# Patient Record
Sex: Female | Born: 1955 | Race: Black or African American | Hispanic: No | Marital: Married | Smoking: Never smoker
Health system: Southern US, Community
[De-identification: ages and names within clinical notes are randomized; demographics above are authoritative.]

## PROBLEM LIST (undated history)

## (undated) HISTORY — PX: ABDOMINAL HYSTERECTOMY: SHX81

---

## 2000-06-20 ENCOUNTER — Emergency Department (HOSPITAL_COMMUNITY): Admission: EM | Admit: 2000-06-20 | Discharge: 2000-06-20 | Payer: Self-pay | Admitting: Emergency Medicine

## 2000-06-20 ENCOUNTER — Encounter: Payer: Self-pay | Admitting: Emergency Medicine

## 2012-01-04 ENCOUNTER — Other Ambulatory Visit: Payer: Self-pay | Admitting: Obstetrics and Gynecology

## 2012-01-04 DIAGNOSIS — Z1231 Encounter for screening mammogram for malignant neoplasm of breast: Secondary | ICD-10-CM

## 2012-01-11 ENCOUNTER — Ambulatory Visit (HOSPITAL_BASED_OUTPATIENT_CLINIC_OR_DEPARTMENT_OTHER)
Admission: RE | Admit: 2012-01-11 | Discharge: 2012-01-11 | Disposition: A | Discharge status: Home / Self Care | Payer: BC Managed Care – PPO | Source: Ambulatory Visit | Attending: Obstetrics and Gynecology | Admitting: Obstetrics and Gynecology

## 2012-01-11 DIAGNOSIS — Z1231 Encounter for screening mammogram for malignant neoplasm of breast: Secondary | ICD-10-CM

## 2014-02-19 ENCOUNTER — Encounter: Payer: Self-pay | Admitting: Podiatry

## 2014-02-19 ENCOUNTER — Ambulatory Visit (INDEPENDENT_AMBULATORY_CARE_PROVIDER_SITE_OTHER): Payer: BC Managed Care – PPO | Admitting: Podiatry

## 2014-02-19 VITALS — BP 130/82 | HR 72 | Ht 66.5 in | Wt 197.0 lb

## 2014-02-19 DIAGNOSIS — M67471 Ganglion, right ankle and foot: Secondary | ICD-10-CM

## 2014-02-19 DIAGNOSIS — M6740 Ganglion, unspecified site: Secondary | ICD-10-CM

## 2014-02-19 DIAGNOSIS — M79671 Pain in right foot: Secondary | ICD-10-CM

## 2014-02-19 DIAGNOSIS — M79673 Pain in unspecified foot: Secondary | ICD-10-CM | POA: Insufficient documentation

## 2014-02-19 DIAGNOSIS — M79609 Pain in unspecified limb: Secondary | ICD-10-CM

## 2014-02-19 NOTE — Progress Notes (Signed)
Subjective: 58 year old female presents complaining of a cystic mass over 2nd digit right. Had it for about 4 months and is getting painful. Patient recalls an incident a heavy object fallen to her foot about 4 months ago.  Objective: Neurovascular status are within normal. Cystic mass raised over distal dorsal end just proximal to nail base 2nd digit right, about 0.8cm in diameter.  No erythema or edema noted.  Assessment: Ganglionic cyst over DIPJ of 2nd digit right foot, 0.8cm in diameter.  Plan: Reviewed findings and available options. Cyst lanced and drained.  Betadine dressing with compression bandage applied. Home care instruction dispensed.

## 2014-02-19 NOTE — Patient Instructions (Signed)
Seen for a cyst on 2nd toe right foot. Noted gel like clear substance drained like from a ganglionic cyst.  Maintain compression bandage for a month. Return as needed.

## 2014-02-22 ENCOUNTER — Encounter: Payer: Self-pay | Admitting: Podiatry

## 2016-07-08 ENCOUNTER — Emergency Department (HOSPITAL_BASED_OUTPATIENT_CLINIC_OR_DEPARTMENT_OTHER)
Admission: EM | Admit: 2016-07-08 | Discharge: 2016-07-08 | Disposition: A | Discharge status: Home / Self Care | Payer: BC Managed Care – PPO | Attending: Emergency Medicine | Admitting: Emergency Medicine

## 2016-07-08 ENCOUNTER — Encounter (HOSPITAL_BASED_OUTPATIENT_CLINIC_OR_DEPARTMENT_OTHER): Payer: Self-pay | Admitting: *Deleted

## 2016-07-08 ENCOUNTER — Emergency Department (HOSPITAL_BASED_OUTPATIENT_CLINIC_OR_DEPARTMENT_OTHER): Payer: BC Managed Care – PPO

## 2016-07-08 DIAGNOSIS — R0602 Shortness of breath: Secondary | ICD-10-CM | POA: Insufficient documentation

## 2016-07-08 DIAGNOSIS — R0600 Dyspnea, unspecified: Secondary | ICD-10-CM

## 2016-07-08 LAB — CBC WITH DIFFERENTIAL/PLATELET
BASOS ABS: 0 10*3/uL (ref 0.0–0.1)
BASOS PCT: 1 %
Eosinophils Absolute: 0 10*3/uL (ref 0.0–0.7)
Eosinophils Relative: 1 %
HCT: 38 % (ref 36.0–46.0)
HEMOGLOBIN: 13.8 g/dL (ref 12.0–15.0)
LYMPHS PCT: 40 %
Lymphs Abs: 1.3 10*3/uL (ref 0.7–4.0)
MCH: 27.4 pg (ref 26.0–34.0)
MCHC: 36.3 g/dL — ABNORMAL HIGH (ref 30.0–36.0)
MCV: 75.5 fL — AB (ref 78.0–100.0)
Monocytes Absolute: 0.2 10*3/uL (ref 0.1–1.0)
Monocytes Relative: 5 %
NEUTROS PCT: 53 %
Neutro Abs: 1.8 10*3/uL (ref 1.7–7.7)
Platelets: 193 10*3/uL (ref 150–400)
RBC: 5.03 MIL/uL (ref 3.87–5.11)
RDW: 13.2 % (ref 11.5–15.5)
WBC: 3.3 10*3/uL — AB (ref 4.0–10.5)

## 2016-07-08 LAB — BASIC METABOLIC PANEL
ANION GAP: 6 (ref 5–15)
BUN: 14 mg/dL (ref 6–20)
CO2: 27 mmol/L (ref 22–32)
Calcium: 9.2 mg/dL (ref 8.9–10.3)
Chloride: 105 mmol/L (ref 101–111)
Creatinine, Ser: 0.81 mg/dL (ref 0.44–1.00)
GLUCOSE: 101 mg/dL — AB (ref 65–99)
POTASSIUM: 3.7 mmol/L (ref 3.5–5.1)
SODIUM: 138 mmol/L (ref 135–145)

## 2016-07-08 LAB — TROPONIN I: Troponin I: <0.03 ng/mL (ref <0.03)

## 2016-07-08 NOTE — ED Triage Notes (Signed)
Pt c/o SOb and increased BP x 2 hrs

## 2016-07-08 NOTE — ED Notes (Signed)
Pt transported to XR at this time.

## 2016-07-08 NOTE — ED Notes (Signed)
Pt on cardiac monitor and automatic VS 

## 2016-07-08 NOTE — ED Provider Notes (Signed)
[facility]   CSN: [id number] Arrival date & time: 07/08/16  1027     History   Chief Complaint Chief Complaint  Patient presents with  . Shortness of Breath    HPI Shaddai Shapley is a 61 y.o. female.  The history is provided by the patient. No language interpreter was used.  Shortness of Breath     Wandalene Abrams is a 62 y.o. female who presents to the Emergency Department complaining of sob.  Over the last 2-3 days she's had increased fatigue with mild runny nose and scratchy throat. Today when she was making breakfast she developed shortness of breath and a gripping sensation in her right arm. Symptoms lasted about 2 hours. Her blood pressure at that time was elevated in the 130s to 140s systolic and her heart rate was in the 80s. Her blood pressure normally runs 109 systolic. No chest pain fever, coughing, abdominal pain, leg swelling or pain. She has no medical history and takes no medications. No history of blood clots. No family history of early coronary artery disease.  History reviewed. No pertinent past medical history.  Patient Active Problem List   Diagnosis Date Noted  . Ganglion cyst of right foot 02/19/2014  . Pain, foot 02/19/2014    Past Surgical History:  Procedure Laterality Date  . ABDOMINAL HYSTERECTOMY      OB History    No data available       Home Medications    Prior to Admission medications   Medication Sig Start Date End Date Taking? Authorizing Provider  Fish Oil-Cholecalciferol (FISH OIL + D3 PO) Take by mouth daily.    Historical Provider, MD  Multiple Vitamin (MULTIVITAMIN) tablet Take 1 tablet by mouth 2 (two) times daily.    Historical Provider, MD  Probiotic Product (PRO-BIOTIC BLEND) CAPS Take by mouth daily.    Historical Provider, MD    Family History History reviewed. No pertinent family history.  Social History Social History  Substance Use Topics  . Smoking status: Never Smoker  . Smokeless  tobacco: Never Used  . Alcohol use No     Allergies   Penicillins   Review of Systems Review of Systems  Respiratory: Positive for shortness of breath.   All other systems reviewed and are negative.    Physical Exam Updated Vital Signs BP 95/66   Pulse 75   Temp 98.8 F (37.1 C) (Oral)   Resp 12   Ht 5\' 4"  (1.626 m)   Wt 180 lb (81.6 kg)   SpO2 100%   BMI 30.90 kg/m   Physical Exam  Constitutional: She is oriented to person, place, and time. She appears well-developed and well-nourished.  HENT:  Head: Normocephalic and atraumatic.  Cardiovascular: Normal rate and regular rhythm.   No murmur heard. Pulmonary/Chest: Effort normal and breath sounds normal. No respiratory distress.  Abdominal: Soft. There is no tenderness. There is no rebound and no guarding.  Musculoskeletal: She exhibits no edema or tenderness.  Neurological: She is alert and oriented to person, place, and time.  Skin: Skin is warm and dry.  Psychiatric: She has a normal mood and affect. Her behavior is normal.  Nursing note and vitals reviewed.    ED Treatments / Results  Labs (all labs ordered are listed, but only abnormal results are displayed) Labs Reviewed  BASIC METABOLIC PANEL - Abnormal; Notable for the following:       Result Value   Glucose, Bld 101 (*)  All other components within normal limits  CBC WITH DIFFERENTIAL/PLATELET - Abnormal; Notable for the following:    WBC 3.3 (*)    MCV 75.5 (*)    MCHC 36.3 (*)    All other components within normal limits  TROPONIN I    EKG  EKG Interpretation  Date/Time:  Sunday July 08 2016 10:54:30 EST Ventricular Rate:  78 PR Interval:    QRS Duration: 78 QT Interval:  361 QTC Calculation: 412 R Axis:   38 Text Interpretation:  Sinus rhythm Biatrial enlargement Anteroseptal infarct, age indeterminate Baseline wander in lead(s) V5 Confirmed by Lincoln Brighamees, Liz [phone]([id number]) on 07/08/2016 11:08:01 AM Also confirmed by Lincoln Brighamees, Liz [phone]([id number]), editor  [place]  CCT, BEVERLY ([id number])  on 07/08/2016 12:16:22 PM       Radiology Dg Chest 2 View  Result Date: 07/08/2016 CLINICAL DATA:  Acute onset shortness of breath and dizziness today. Hypertension. EXAM: CHEST  2 VIEW COMPARISON:  None. FINDINGS: The heart size and mediastinal contours are within normal limits. Both lungs are clear. The visualized skeletal structures are unremarkable. IMPRESSION: No active cardiopulmonary disease. Electronically Signed   By: Myles RosenthalJohn  Stahl M.D.   On: 07/08/2016 11:39    Procedures Procedures (including critical care time)  Medications Ordered in ED Medications - No data to display   Initial Impression / Assessment and Plan / ED Course  I have reviewed the triage vital signs and the nursing notes.  Pertinent labs & imaging results that were available during my care of the patient were reviewed by me and considered in my medical decision making (see chart for details).     Patient here following an episode of shortness of breath and right arm discomfort. She is asymptomatic in the emergency department. She is low risk for cardiac event and presentation is not consistent with ACS, PE, CHF, pneumonia. Discussed with patient outpatient follow up and return precautions.  Final Clinical Impressions(s) / ED Diagnoses   Final diagnoses:  Dyspnea, unspecified type    New Prescriptions Discharge Medication List as of 07/08/2016 12:32 PM       Tilden FossaElizabeth Zacary Bauer, MD 07/09/16 1119

## 2017-12-07 IMAGING — CR DG CHEST 2V
2 series · 2 of 2 positions shown · non-contrast
Comparison: None.

CLINICAL DATA: Acute onset shortness of breath and dizziness today.
Hypertension.

EXAM:
CHEST  2 VIEW

[w chest pa]
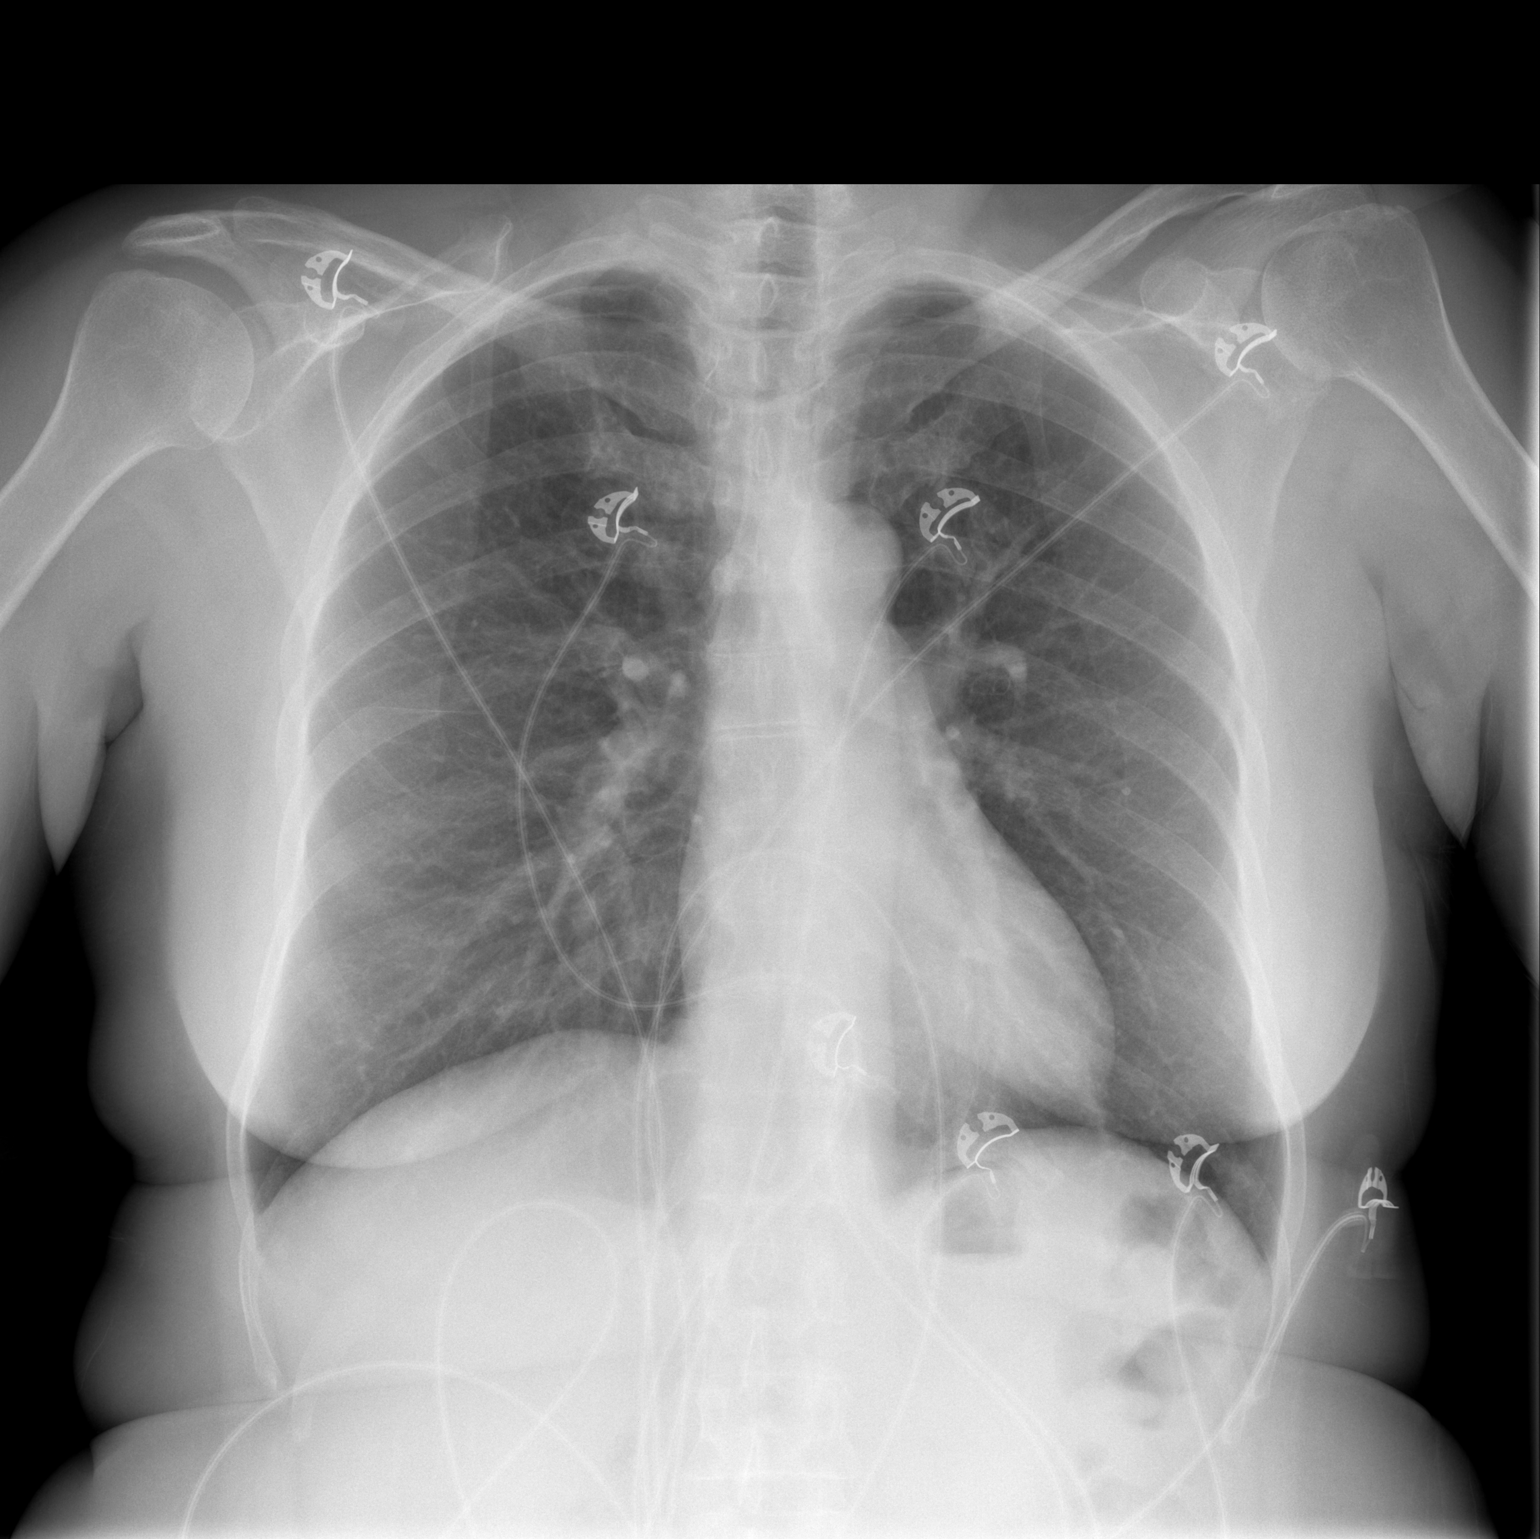

[w chest lat]
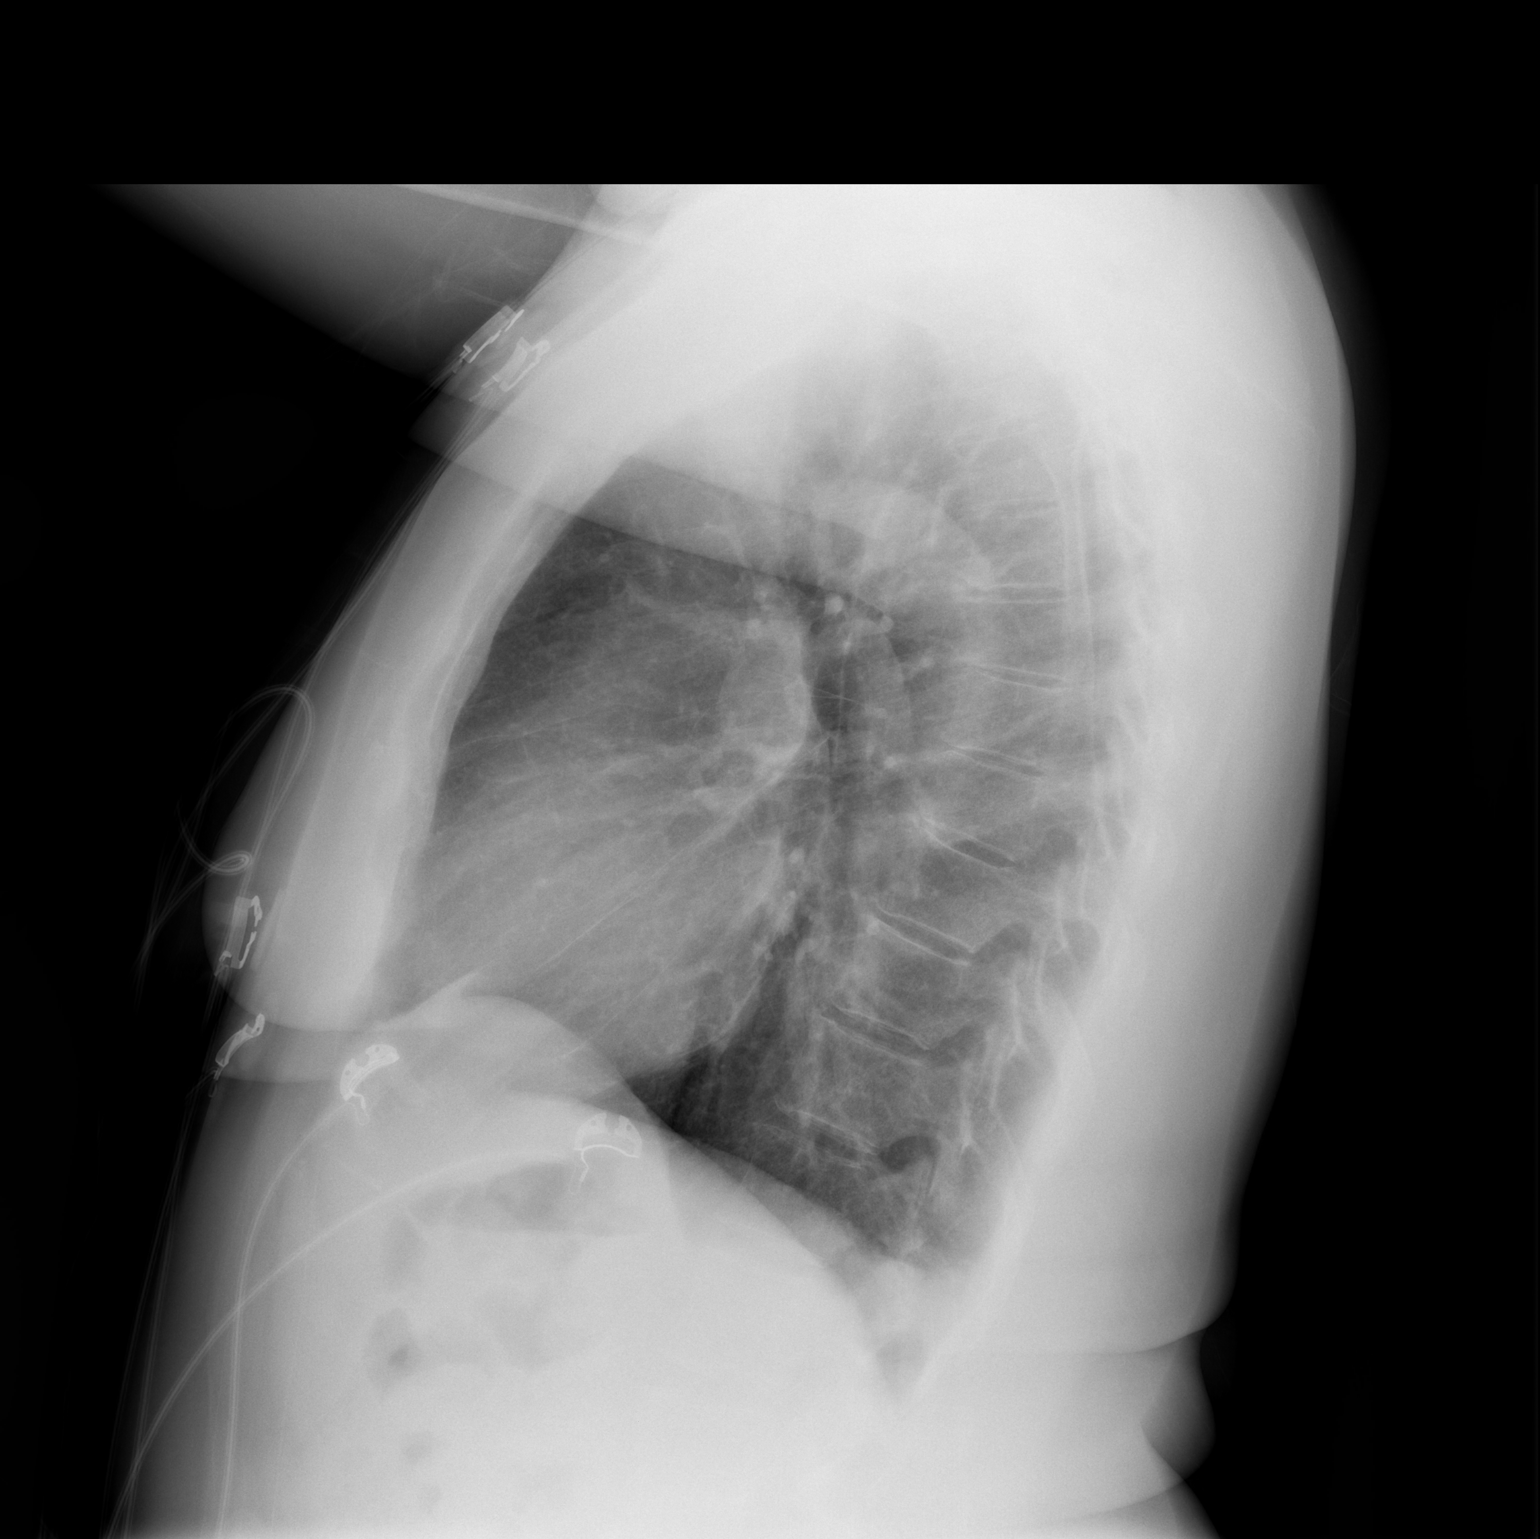

[2 of 2 positions shown; findings below may reference images not displayed]

FINDINGS: The heart size and mediastinal contours are within normal limits.
Both lungs are clear. The visualized skeletal structures are
unremarkable.
IMPRESSION: No active cardiopulmonary disease.

## 2020-06-28 ENCOUNTER — Emergency Department (HOSPITAL_BASED_OUTPATIENT_CLINIC_OR_DEPARTMENT_OTHER)
Admission: EM | Admit: 2020-06-28 | Discharge: 2020-06-28 | Disposition: A | Discharge status: Home / Self Care | Payer: BC Managed Care – PPO | Attending: Emergency Medicine | Admitting: Emergency Medicine

## 2020-06-28 ENCOUNTER — Encounter (HOSPITAL_BASED_OUTPATIENT_CLINIC_OR_DEPARTMENT_OTHER): Payer: Self-pay

## 2020-06-28 ENCOUNTER — Other Ambulatory Visit: Payer: Self-pay

## 2020-06-28 ENCOUNTER — Emergency Department (HOSPITAL_BASED_OUTPATIENT_CLINIC_OR_DEPARTMENT_OTHER): Payer: BC Managed Care – PPO

## 2020-06-28 DIAGNOSIS — R911 Solitary pulmonary nodule: Secondary | ICD-10-CM | POA: Insufficient documentation

## 2020-06-28 DIAGNOSIS — R42 Dizziness and giddiness: Secondary | ICD-10-CM | POA: Diagnosis not present

## 2020-06-28 DIAGNOSIS — Z20822 Contact with and (suspected) exposure to covid-19: Secondary | ICD-10-CM | POA: Diagnosis not present

## 2020-06-28 DIAGNOSIS — R0602 Shortness of breath: Secondary | ICD-10-CM | POA: Diagnosis not present

## 2020-06-28 DIAGNOSIS — R509 Fever, unspecified: Secondary | ICD-10-CM | POA: Diagnosis present

## 2020-06-28 DIAGNOSIS — J181 Lobar pneumonia, unspecified organism: Secondary | ICD-10-CM | POA: Diagnosis not present

## 2020-06-28 DIAGNOSIS — R519 Headache, unspecified: Secondary | ICD-10-CM | POA: Diagnosis not present

## 2020-06-28 DIAGNOSIS — J189 Pneumonia, unspecified organism: Secondary | ICD-10-CM

## 2020-06-28 LAB — COMPREHENSIVE METABOLIC PANEL
ALT: 37 U/L (ref 0–44)
AST: 37 U/L (ref 15–41)
Albumin: 3.7 g/dL (ref 3.5–5.0)
Alkaline Phosphatase: 66 U/L (ref 38–126)
Anion gap: 11 (ref 5–15)
BUN: 7 mg/dL — ABNORMAL LOW (ref 8–23)
CO2: 22 mmol/L (ref 22–32)
Calcium: 8.6 mg/dL — ABNORMAL LOW (ref 8.9–10.3)
Chloride: 101 mmol/L (ref 98–111)
Creatinine, Ser: 0.85 mg/dL (ref 0.44–1.00)
GFR, Estimated: >60 mL/min (ref >60)
Glucose, Bld: 114 mg/dL — ABNORMAL HIGH (ref 70–99)
Potassium: 3.7 mmol/L (ref 3.5–5.1)
Sodium: 134 mmol/L — ABNORMAL LOW (ref 135–145)
Total Bilirubin: 0.8 mg/dL (ref 0.3–1.2)
Total Protein: 7.1 g/dL (ref 6.5–8.1)

## 2020-06-28 LAB — CBC WITH DIFFERENTIAL/PLATELET
Abs Immature Granulocytes: 0.03 10*3/uL (ref 0.00–0.07)
Basophils Absolute: 0 10*3/uL (ref 0.0–0.1)
Basophils Relative: 0 %
Eosinophils Absolute: 0 10*3/uL (ref 0.0–0.5)
Eosinophils Relative: 0 %
HCT: 38.9 % (ref 36.0–46.0)
Hemoglobin: 13.9 g/dL (ref 12.0–15.0)
Immature Granulocytes: 0 %
Lymphocytes Relative: 10 %
Lymphs Abs: 1 10*3/uL (ref 0.7–4.0)
MCH: 27.5 pg (ref 26.0–34.0)
MCHC: 35.7 g/dL (ref 30.0–36.0)
MCV: 77 fL — ABNORMAL LOW (ref 80.0–100.0)
Monocytes Absolute: 0.6 10*3/uL (ref 0.1–1.0)
Monocytes Relative: 6 %
Neutro Abs: 8.3 10*3/uL — ABNORMAL HIGH (ref 1.7–7.7)
Neutrophils Relative %: 84 %
Platelets: 197 10*3/uL (ref 150–400)
RBC: 5.05 MIL/uL (ref 3.87–5.11)
RDW: 13.2 % (ref 11.5–15.5)
WBC: 9.9 10*3/uL (ref 4.0–10.5)
nRBC: 0 % (ref 0.0–0.2)

## 2020-06-28 LAB — RESP PANEL BY RT-PCR (FLU A&B, COVID) ARPGX2
Influenza A by PCR: NEGATIVE
Influenza B by PCR: NEGATIVE
SARS Coronavirus 2 by RT PCR: NEGATIVE

## 2020-06-28 LAB — FIBRINOGEN: Fibrinogen: 770 mg/dL — ABNORMAL HIGH (ref 210–475)

## 2020-06-28 LAB — URINALYSIS, ROUTINE W REFLEX MICROSCOPIC
Bilirubin Urine: NEGATIVE
Glucose, UA: NEGATIVE mg/dL
Ketones, ur: 40 mg/dL — AB
Nitrite: NEGATIVE
Protein, ur: NEGATIVE mg/dL
Specific Gravity, Urine: 1.01 (ref 1.005–1.030)
pH: 6 (ref 5.0–8.0)

## 2020-06-28 LAB — C-REACTIVE PROTEIN: CRP: 10 mg/dL — ABNORMAL HIGH (ref <1.0)

## 2020-06-28 LAB — FERRITIN: Ferritin: 159 ng/mL (ref 11–307)

## 2020-06-28 LAB — LACTIC ACID, PLASMA: Lactic Acid, Venous: 0.9 mmol/L (ref 0.5–1.9)

## 2020-06-28 LAB — URINALYSIS, MICROSCOPIC (REFLEX)

## 2020-06-28 LAB — LACTATE DEHYDROGENASE: LDH: 233 U/L — ABNORMAL HIGH (ref 98–192)

## 2020-06-28 LAB — PROCALCITONIN: Procalcitonin: 0.25 ng/mL

## 2020-06-28 LAB — D-DIMER, QUANTITATIVE: D-Dimer, Quant: 2.84 ug/mL-FEU — ABNORMAL HIGH (ref 0.00–0.50)

## 2020-06-28 LAB — TRIGLYCERIDES: Triglycerides: 51 mg/dL (ref <150)

## 2020-06-28 MED ORDER — ACETAMINOPHEN 325 MG PO TABS
650.0000 mg | ORAL_TABLET | Freq: Once | ORAL | Status: AC
  Administered 2020-06-28: 650 mg via ORAL

## 2020-06-28 MED ORDER — ACETAMINOPHEN 325 MG PO TABS
ORAL_TABLET | ORAL | Status: AC
  Filled 2020-06-28: qty 2

## 2020-06-28 MED ORDER — DOXYCYCLINE HYCLATE 100 MG PO CAPS: 100.0000 mg | ORAL_CAPSULE | Freq: Two times a day (BID) | ORAL | 0 refills | Status: AC

## 2020-06-28 MED ORDER — SODIUM CHLORIDE 0.9 % IV SOLN
500.0000 mg | Freq: Once | INTRAVENOUS | Status: AC
  Administered 2020-06-28: 500 mg via INTRAVENOUS
  Filled 2020-06-28: qty 500

## 2020-06-28 MED ORDER — IOHEXOL 350 MG/ML SOLN
100.0000 mL | Freq: Once | INTRAVENOUS | Status: AC | PRN
  Administered 2020-06-28: 75 mL via INTRAVENOUS

## 2020-06-28 MED ORDER — SODIUM CHLORIDE 0.9 % IV SOLN
1.0000 g | Freq: Once | INTRAVENOUS | Status: AC
  Administered 2020-06-28: 1 g via INTRAVENOUS
  Filled 2020-06-28: qty 10

## 2020-06-28 NOTE — ED Triage Notes (Signed)
Fever, cough, sob, headache since Saturday.  Negative home covid test yesterday.

## 2020-06-28 NOTE — ED Provider Notes (Signed)
[facility] EMERGENCY DEPARTMENT Provider Note  CSN: [id number] Arrival date & time: 06/28/20 0755    History Chief Complaint  Patient presents with  . Fever    HPI  Maureen Green is a 65 y.o. female with no significant PMH reports 2 days of fever, cough, headaches, and dizziness. She has not had any sick contacts. She took a home Covid test yesterday which was neg. She has been vaccinated, but not boosted. She reports some SOB, especially with walking around.    History reviewed. No pertinent past medical history.  Past Surgical History:  Procedure Laterality Date  . ABDOMINAL HYSTERECTOMY      History reviewed. No pertinent family history.  Social History   Tobacco Use  . Smoking status: Never Smoker  . Smokeless tobacco: Never Used  Substance Use Topics  . Alcohol use: No  . Drug use: No     Home Medications Prior to Admission medications   Medication Sig Start Date End Date Taking? Authorizing Provider  doxycycline (VIBRAMYCIN) 100 MG capsule Take 1 capsule (100 mg total) by mouth 2 (two) times daily. 06/28/20  Yes Pollyann Savoy, MD  Fish Oil-Cholecalciferol (FISH OIL + D3 PO) Take by mouth daily.    [provider]  Multiple Vitamin (MULTIVITAMIN) tablet Take 1 tablet by mouth 2 (two) times daily.    [provider]  Probiotic Product (PRO-BIOTIC BLEND) CAPS Take by mouth daily.    [provider]     Allergies    Penicillins   Review of Systems   Review of Systems A comprehensive review of systems was completed and negative except as noted in HPI.    Physical Exam BP 132/82 (BP Location: Right Arm)   Pulse 82   Temp 98.6 F (37 C) (Oral)   Resp 20   Ht 5\' 4"  (1.626 m)   Wt 81.6 kg   SpO2 95%   BMI 30.90 kg/m   Physical Exam Vitals and nursing note reviewed.  Constitutional:      Appearance: Normal appearance.  HENT:     Head: Normocephalic and atraumatic.     Nose: Nose normal.      Mouth/Throat:     Mouth: Mucous membranes are moist.  Eyes:     Extraocular Movements: Extraocular movements intact.     Conjunctiva/sclera: Conjunctivae normal.  Cardiovascular:     Rate and Rhythm: Normal rate.  Pulmonary:     Effort: Pulmonary effort is normal.     Breath sounds: Normal breath sounds.  Abdominal:     General: Abdomen is flat.     Palpations: Abdomen is soft.     Tenderness: There is no abdominal tenderness.  Musculoskeletal:        General: No swelling. Normal range of motion.     Cervical back: Neck supple.  Skin:    General: Skin is warm and dry.  Neurological:     General: No focal deficit present.     Mental Status: She is alert.  Psychiatric:        Mood and Affect: Mood normal.      ED Results / Procedures / Treatments   Labs (all labs ordered are listed, but only abnormal results are displayed) Labs Reviewed  CBC WITH DIFFERENTIAL/PLATELET - Abnormal; Notable for the following components:      Result Value   MCV 77.0 (*)    Neutro Abs 8.3 (*)    All other components within normal limits  COMPREHENSIVE  METABOLIC PANEL - Abnormal; Notable for the following components:   Sodium 134 (*)    Glucose, Bld 114 (*)    BUN 7 (*)    Calcium 8.6 (*)    All other components within normal limits  D-DIMER, QUANTITATIVE (NOT AT [facility]) - Abnormal; Notable for the following components:   D-Dimer, Quant 2.84 (*)    All other components within normal limits  LACTATE DEHYDROGENASE - Abnormal; Notable for the following components:   LDH 233 (*)    All other components within normal limits  FIBRINOGEN - Abnormal; Notable for the following components:   Fibrinogen 770 (*)    All other components within normal limits  C-REACTIVE PROTEIN - Abnormal; Notable for the following components:   CRP 10.0 (*)    All other components within normal limits  URINALYSIS, ROUTINE W REFLEX MICROSCOPIC - Abnormal; Notable for the following components:   Hgb urine dipstick  SMALL (*)    Ketones, ur 40 (*)    Leukocytes,Ua MODERATE (*)    All other components within normal limits  URINALYSIS, MICROSCOPIC (REFLEX) - Abnormal; Notable for the following components:   Bacteria, UA FEW (*)    All other components within normal limits  RESP PANEL BY RT-PCR (FLU A&B, COVID) ARPGX2  CULTURE, BLOOD (ROUTINE X 2)  CULTURE, BLOOD (ROUTINE X 2)  LACTIC ACID, PLASMA  PROCALCITONIN  FERRITIN  TRIGLYCERIDES    EKG EKG Interpretation  Date/Time:  Tuesday June 28 2020 08:46:22 EST Ventricular Rate:  96 PR Interval:    QRS Duration: 81 QT Interval:  307 QTC Calculation: 388 R Axis:   49 Text Interpretation: Sinus tachycardia Premature ventricular complexes Biatrial enlargement Borderline T abnormalities, lateral leads Baseline wander in lead(s) V3 V6 Since last tracing Premature ventricular complexes NOW PRESENT Confirmed by Susy Frizzle [phone]) on 06/28/2020 8:59:39 AM    Radiology CT Angio Chest PE W/Cm &/Or Wo Cm  Result Date: 06/28/2020 CLINICAL DATA:  65 year old female with fever, cough, shortness of breath and headache for 3 days. EXAM: CT ANGIOGRAPHY CHEST WITH CONTRAST TECHNIQUE: Multidetector CT imaging of the chest was performed using the standard protocol during bolus administration of intravenous contrast. Multiplanar CT image reconstructions and MIPs were obtained to evaluate the vascular anatomy. CONTRAST:  73mL OMNIPAQUE IOHEXOL 350 MG/ML SOLN COMPARISON:  Portable chest earlier today. FINDINGS: Cardiovascular: Good contrast bolus timing in the pulmonary arterial tree. No focal filling defect identified in the pulmonary arteries to suggest acute pulmonary embolism. No cardiomegaly or pericardial effusion. Negative visible aorta. No calcified coronary artery atherosclerosis is evident. Mediastinum/Nodes: Small reactive appearing left inferior hilar lymph node. Otherwise negative. Lungs/Pleura: Left lower lobe posterior and lateral basal segment  consolidation. Trace superimposed pleural fluid in the left costophrenic angle. Major airways are clear. There is a small 6 mm right upper lobe lung nodule located near the minor fissure (series 5, image 40) and a subtle 2-3 mm right upper lobe nodule on image 32. Minor atelectasis in the right costophrenic angle. Upper Abdomen: Possible small gastric hiatal hernia. Negative visible liver, gallbladder, spleen, pancreas, adrenal glands, kidneys and other bowel in the upper abdomen. Musculoskeletal: Negative. Review of the MIP images confirms the above findings. IMPRESSION: 1. Negative for acute pulmonary embolus. 2. Left Lower Lobe Pneumonia with trace pleural fluid. 3. Two small right upper lobe lung nodules are nonspecific but probably postinflammatory, the larger is 6 mm near the minor fissure. Non-contrast chest CT in 6 months is recommended. If the nodules are stable  at time of repeat CT, then future CT at 18-24 months (from today's scan) is considered optional for low-risk patients, but is recommended for high-risk patients. This recommendation follows the consensus statement: Guidelines for Management of Incidental Pulmonary Nodules Detected on CT Images: From the Fleischner Society 2017; Radiology 2017; 284:228-243. Electronically Signed   By: Odessa Fleming M.D.   On: 06/28/2020 10:56   DG Chest Port 1 View  Result Date: 06/28/2020 CLINICAL DATA:  Cough, fever, shortness of breath and headache since Saturday, negative home COVID test yesterday EXAM: PORTABLE CHEST 1 VIEW COMPARISON:  Portable exam 0843 hours compared to 07/08/2016 FINDINGS: Normal heart size, mediastinal contours, and pulmonary vascularity. Slightly decreased lung volume since previous exam. Mild atelectasis versus infiltrate at lung bases. Upper lungs clear. No pleural effusion or pneumothorax. Osseous structures unremarkable. IMPRESSION: Mild atelectasis versus infiltrate at lung bases. Electronically Signed   By: Ulyses Southward M.D.   On:  06/28/2020 09:02    Procedures Procedures  Medications Ordered in the ED Medications  acetaminophen (TYLENOL) tablet 650 mg (650 mg Oral Given 06/28/20 0859)  iohexol (OMNIPAQUE) 350 MG/ML injection 100 mL (75 mLs Intravenous Contrast Given 06/28/20 1034)  cefTRIAXone (ROCEPHIN) 1 g in sodium chloride 0.9 % 100 mL IVPB (0 g Intravenous Stopped 06/28/20 1300)  azithromycin (ZITHROMAX) 500 mg in sodium chloride 0.9 % 250 mL IVPB (0 mg Intravenous Stopped 06/28/20 1233)     MDM Rules/Calculators/A&P MDM Patient with symptoms concerning for Covid. Her triage SpO2 94% but while talking she briefly dipped to 88% before recovering. No significant hypoxia with brief ambulation at bedside. Will check labs/CXR and Covid swab. She is borderline for requiring admission depending on how her SpO2 does during her ED stay. She is amenable to this plan.  ED Course  I have reviewed the triage vital signs and the nursing notes.  Pertinent labs & imaging results that were available during my care of the patient were reviewed by me and considered in my medical decision making (see chart for details).  Clinical Course as of 06/28/20 1346  Tue Jun 28, 2020  0854 CBC is normal.  [CS]  0908 CXR with infiltrate vs atelectasis. [CS]  0912 Dimer is mildly elevated.  [CS]  [id number] CMP unremarkable.  [CS]  0936 Covid is neg. Given elevated Dimer, will send for CTA to eval PE and/or occult PNA. She does report she just recently flew home from [place].  [CS]  1103 CT images and results reviewed and discussed with the patient including incidental nodules. Will give Rocephin/Zithromax for CAP. She has not had any significant hypoxia while in the ED.  [CS]  1339 Patient's SpO2 remains normal with walking. Plan discharge home with Rx for Abx, PCP follow up. RTED for any other concerns.  [CS]    Clinical Course User Index [CS] Pollyann Savoy, MD    Final Clinical Impression(s) / ED Diagnoses Final diagnoses:  Community  acquired pneumonia of left lower lobe of lung  Pulmonary nodule    Rx / DC Orders ED Discharge Orders         Ordered    doxycycline (VIBRAMYCIN) 100 MG capsule  2 times daily        06/28/20 1346           Pollyann Savoy, MD 06/28/20 1346

## 2020-06-28 NOTE — ED Notes (Signed)
Pt ambulated around the room. O2 sats 94-95%

## 2020-06-28 NOTE — ED Notes (Signed)
Patient transported to CT, will draw lactic acid when she returns

## 2020-07-03 LAB — CULTURE, BLOOD (ROUTINE X 2)
Culture: NO GROWTH
Culture: NO GROWTH
Special Requests: ADEQUATE

## 2021-11-27 IMAGING — CT CT ANGIO CHEST
2 of 8 series · 18 of 36 positions shown · IV contrast (Omnipaque)
Comparison: Portable chest earlier today.

CLINICAL DATA: 64-year-old female with fever, cough, shortness of
breath and headache for 3 days.

EXAM:
CT ANGIOGRAPHY CHEST WITH CONTRAST
TECHNIQUE: Multidetector CT imaging of the chest was performed using the
standard protocol during bolus administration of intravenous
contrast. Multiplanar CT image reconstructions and MIPs were
obtained to evaluate the vascular anatomy.
CONTRAST:  75mL OMNIPAQUE IOHEXOL 350 MG/ML SOLN

[Series 6: pe thins · axial · 0.61mm/px · z∈[-288,-39]mm · 17 of 279 slices shown]
[im 15/279  lung]
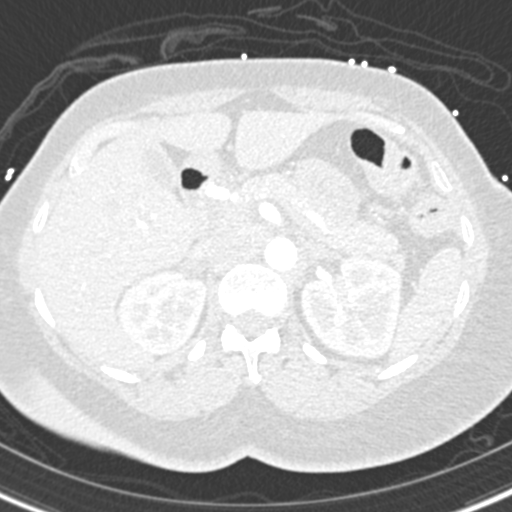
[im 30/279  mediastinal]
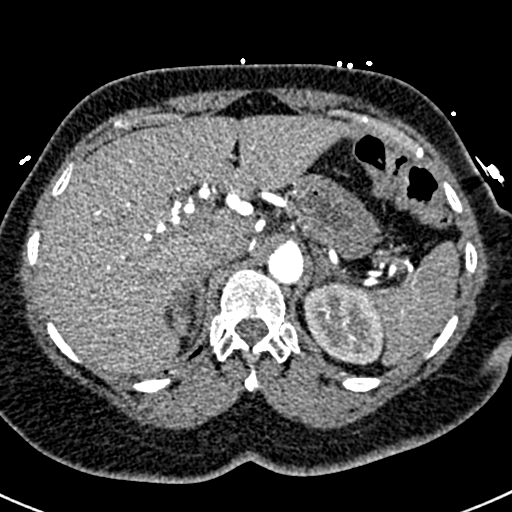
[im 44/279  lung]
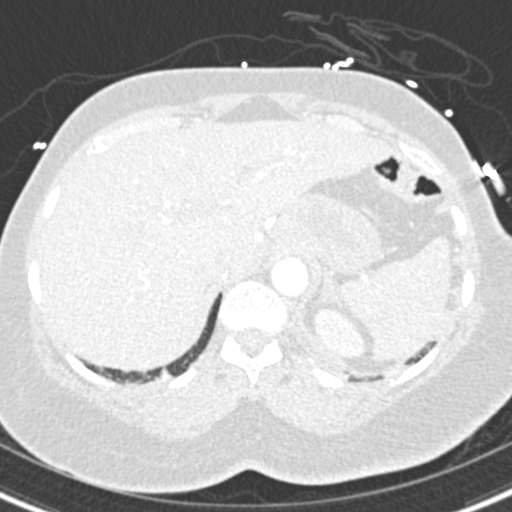
[im 59/279  mediastinal]
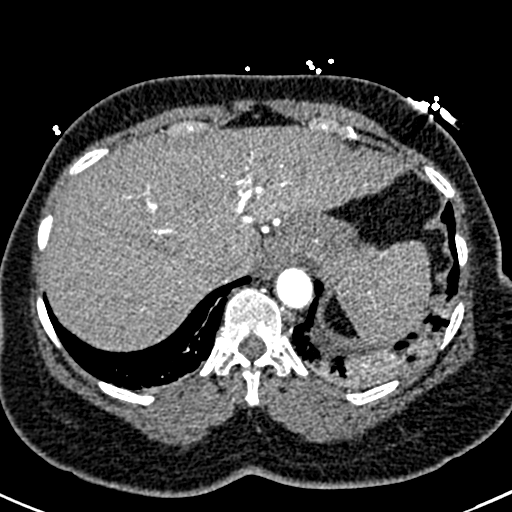
[im 74/279  lung]
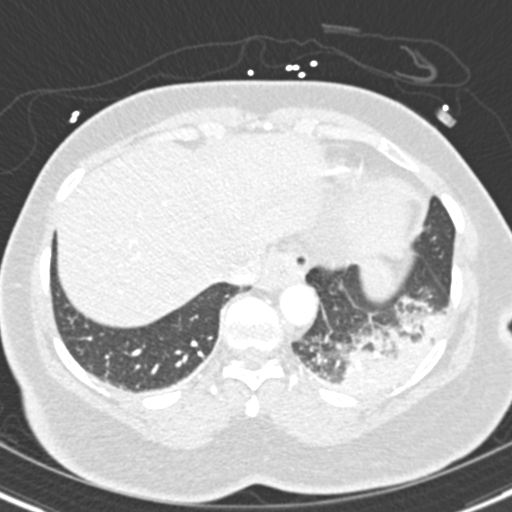
[im 88/279  mediastinal]
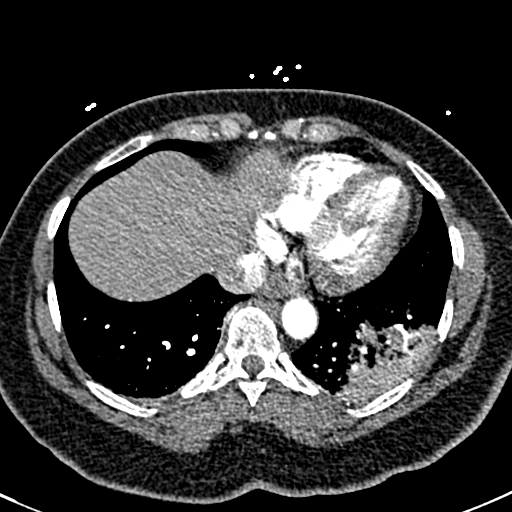
[im 103/279  lung]
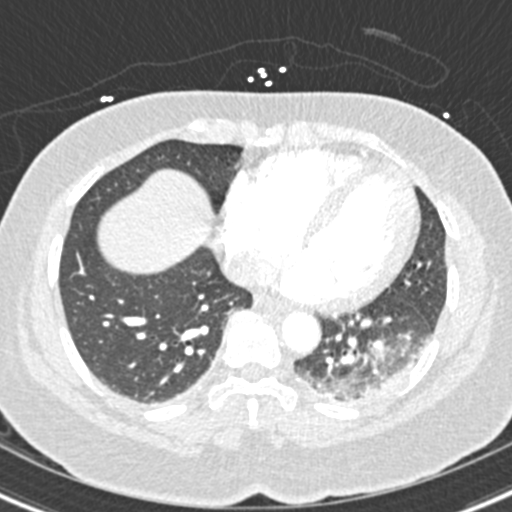
[im 118/279  mediastinal]
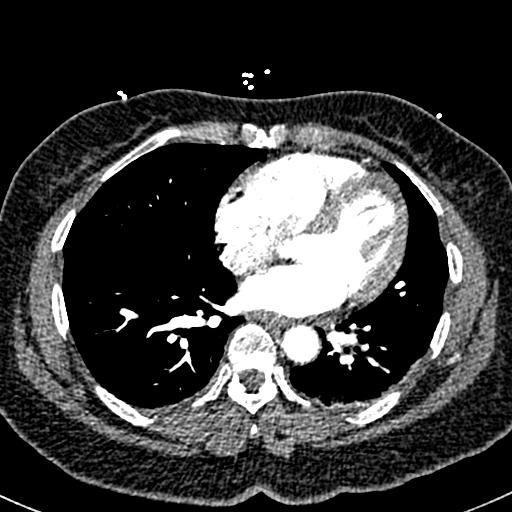
[im 147/279  lung]
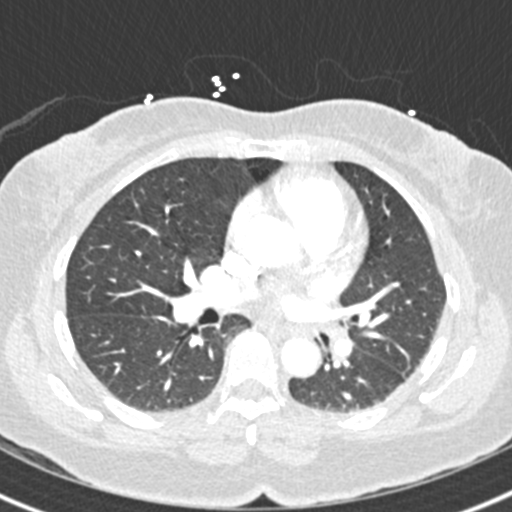
[im 161/279  mediastinal]
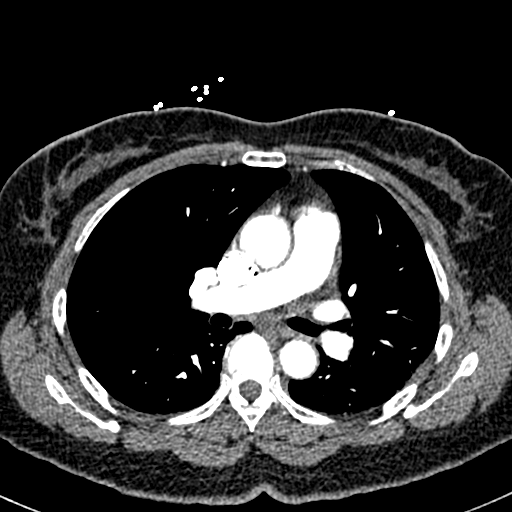
[im 176/279  lung]
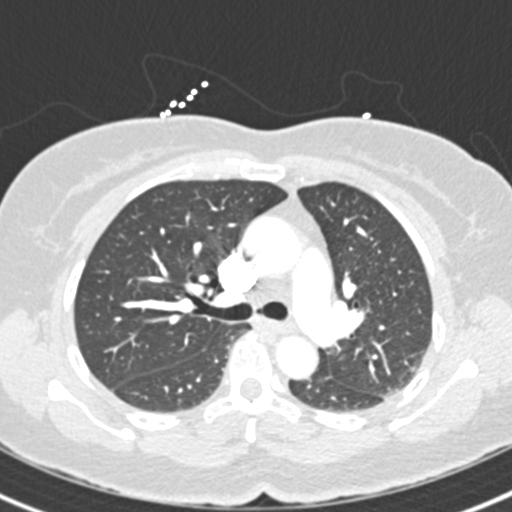
[im 191/279  mediastinal]
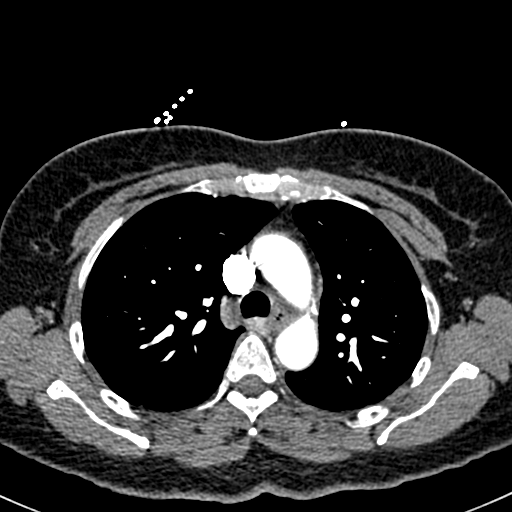
[im 205/279  lung]
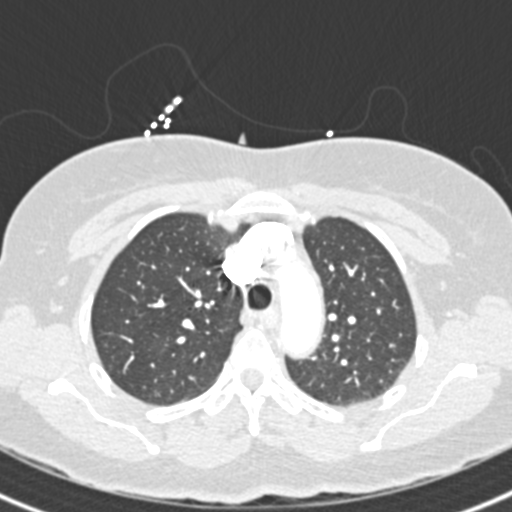
[im 220/279  mediastinal]
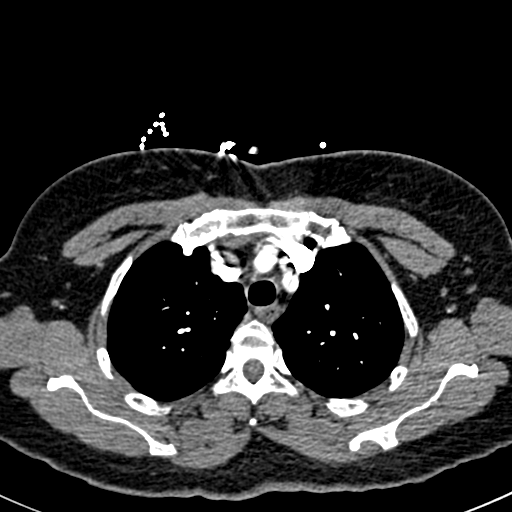
[im 235/279  lung]
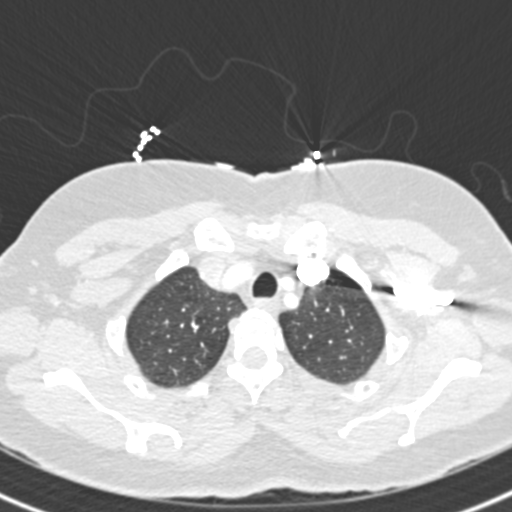
[im 249/279  mediastinal]
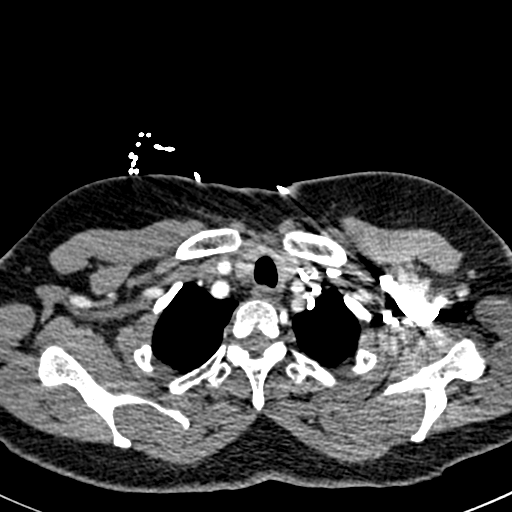
[im 264/279  lung]
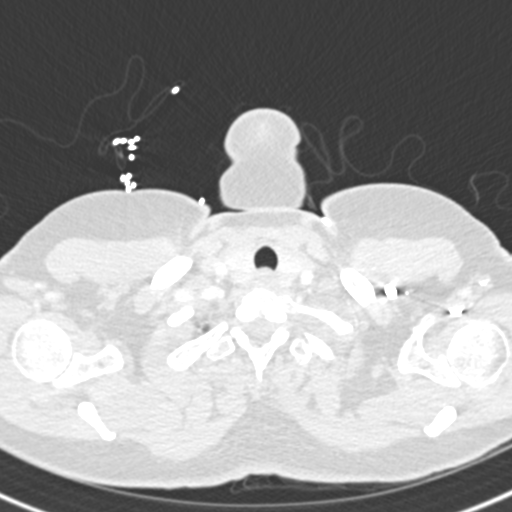

[Series 7: pe coronal mpr · coronal · 0.59mm/px · 1 of 119 slices shown]
[im 60/119  mediastinal]
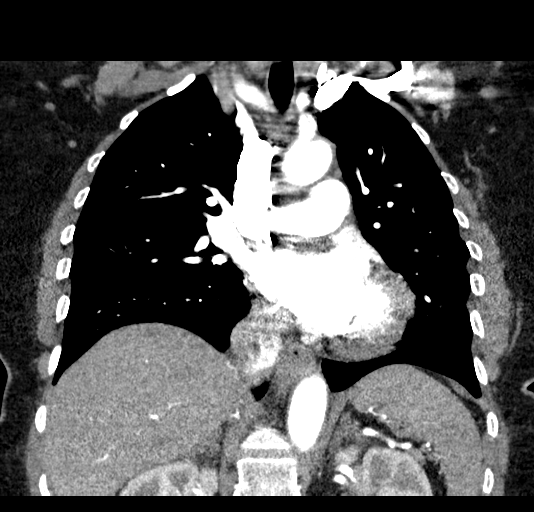

[18 of 36 positions shown; findings below may reference images not displayed]

FINDINGS: Cardiovascular: Good contrast bolus timing in the pulmonary arterial
tree.

No focal filling defect identified in the pulmonary arteries to
suggest acute pulmonary embolism.

No cardiomegaly or pericardial effusion. Negative visible aorta. No
calcified coronary artery atherosclerosis is evident.

Mediastinum/Nodes: Small reactive appearing left inferior hilar
lymph node. Otherwise negative.

Lungs/Pleura: Left lower lobe posterior and lateral basal segment
consolidation. Trace superimposed pleural fluid in the left
costophrenic angle.

Major airways are clear. There is a small 6 mm right upper lobe lung
nodule located near the minor fissure (series 5, image 40) and a
subtle 2-3 mm right upper lobe nodule on image 32. Minor atelectasis
in the right costophrenic angle.

Upper Abdomen: Possible small gastric hiatal hernia. Negative
visible liver, gallbladder, spleen, pancreas, adrenal glands,
kidneys and other bowel in the upper abdomen.

Musculoskeletal: Negative.

Review of the MIP images confirms the above findings.
IMPRESSION: 1. Negative for acute pulmonary embolus.
2. Left Lower Lobe Pneumonia with trace pleural fluid.
3. Two small right upper lobe lung nodules are nonspecific but
probably postinflammatory, the larger is 6 mm near the minor
fissure.
Non-contrast chest CT in 6 months is recommended. If the nodules are
stable at time of repeat CT, then future CT at 18-24 months (from
today's scan) is considered optional for low-risk patients, but is
recommended for high-risk patients.
This recommendation follows the consensus statement: Guidelines for
Management of Incidental Pulmonary Nodules Detected on CT Images:

## 2022-12-28 ENCOUNTER — Ambulatory Visit: Payer: Self-pay | Admitting: Family Medicine
